# Patient Record
Sex: Female | Born: 1980 | Race: White | Hispanic: No | Marital: Married | Smoking: Never smoker
Health system: Southern US, Community
[De-identification: ages and names within clinical notes are randomized; demographics above are authoritative.]

## PROBLEM LIST (undated history)

## (undated) DIAGNOSIS — O99119 Other diseases of the blood and blood-forming organs and certain disorders involving the immune mechanism complicating pregnancy, unspecified trimester: Secondary | ICD-10-CM

## (undated) DIAGNOSIS — D696 Thrombocytopenia, unspecified: Secondary | ICD-10-CM

## (undated) DIAGNOSIS — R51 Headache: Secondary | ICD-10-CM

## (undated) DIAGNOSIS — O139 Gestational [pregnancy-induced] hypertension without significant proteinuria, unspecified trimester: Secondary | ICD-10-CM

## (undated) DIAGNOSIS — E039 Hypothyroidism, unspecified: Secondary | ICD-10-CM

## (undated) HISTORY — PX: MOUTH SURGERY: SHX715

---

## 2003-08-20 ENCOUNTER — Other Ambulatory Visit: Admission: RE | Admit: 2003-08-20 | Discharge: 2003-08-20 | Payer: Self-pay | Admitting: Obstetrics and Gynecology

## 2003-08-20 ENCOUNTER — Other Ambulatory Visit: Admission: RE | Admit: 2003-08-20 | Discharge: 2003-08-20 | Payer: Self-pay | Admitting: *Deleted

## 2004-09-03 ENCOUNTER — Other Ambulatory Visit: Admission: RE | Admit: 2004-09-03 | Discharge: 2004-09-03 | Payer: Self-pay | Admitting: Obstetrics and Gynecology

## 2004-11-15 ENCOUNTER — Ambulatory Visit (HOSPITAL_COMMUNITY): Admission: RE | Admit: 2004-11-15 | Discharge: 2004-11-15 | Payer: Self-pay | Admitting: *Deleted

## 2004-11-15 ENCOUNTER — Encounter (INDEPENDENT_AMBULATORY_CARE_PROVIDER_SITE_OTHER): Payer: Self-pay | Admitting: *Deleted

## 2005-09-06 ENCOUNTER — Other Ambulatory Visit: Admission: RE | Admit: 2005-09-06 | Discharge: 2005-09-06 | Payer: Self-pay | Admitting: Obstetrics & Gynecology

## 2006-09-12 ENCOUNTER — Other Ambulatory Visit: Admission: RE | Admit: 2006-09-12 | Discharge: 2006-09-12 | Payer: Self-pay | Admitting: Obstetrics & Gynecology

## 2007-10-18 ENCOUNTER — Other Ambulatory Visit: Admission: RE | Admit: 2007-10-18 | Discharge: 2007-10-18 | Payer: Self-pay | Admitting: Obstetrics and Gynecology

## 2008-07-21 ENCOUNTER — Inpatient Hospital Stay (HOSPITAL_COMMUNITY): Admission: AD | Admit: 2008-07-21 | Discharge: 2008-07-25 | Payer: Self-pay | Admitting: Obstetrics and Gynecology

## 2010-06-29 LAB — HEPATITIS B SURFACE ANTIGEN: Hepatitis B Surface Ag: NEGATIVE

## 2010-06-29 LAB — HIV ANTIBODY (ROUTINE TESTING W REFLEX): HIV: NONREACTIVE

## 2010-06-29 LAB — RPR: RPR: NONREACTIVE

## 2010-06-29 LAB — TYPE AND SCREEN: Antibody Screen: NEGATIVE

## 2010-07-18 NOTE — L&D Delivery Note (Signed)
This patient had quick progress in second stage II spontaneous vaginal delivery of a 9 lbs. 0 oz. female, Apgars 8 and 9, cord pH was sent and is pending. McRoberts maneuver with suprapubic pressure by the nursing assistant, moderate downward traction to effect delivery the anterior shoulder easily delivered the torso. Placenta spontaneous intact. EBL 300 cc. A superficial second-degree perineal laceration was repaired. Mother and baby doing well at that point

## 2010-11-01 LAB — COMPREHENSIVE METABOLIC PANEL
ALT: 14 U/L (ref 0–35)
ALT: 15 U/L (ref 0–35)
ALT: 16 U/L (ref 0–35)
AST: 20 U/L (ref 0–37)
AST: 21 U/L (ref 0–37)
AST: 26 U/L (ref 0–37)
AST: 28 U/L (ref 0–37)
Albumin: 2.8 g/dL — ABNORMAL LOW (ref 3.5–5.2)
Albumin: 2.9 g/dL — ABNORMAL LOW (ref 3.5–5.2)
Alkaline Phosphatase: 53 U/L (ref 39–117)
Alkaline Phosphatase: 61 U/L (ref 39–117)
Alkaline Phosphatase: 87 U/L (ref 39–117)
BUN: 6 mg/dL (ref 6–23)
BUN: 6 mg/dL (ref 6–23)
BUN: 7 mg/dL (ref 6–23)
CO2: 23 mEq/L (ref 19–32)
CO2: 24 mEq/L (ref 19–32)
CO2: 28 mEq/L (ref 19–32)
CO2: 28 mEq/L (ref 19–32)
Calcium: 8.4 mg/dL (ref 8.4–10.5)
Calcium: 8.5 mg/dL (ref 8.4–10.5)
Calcium: 8.5 mg/dL (ref 8.4–10.5)
Calcium: 8.9 mg/dL (ref 8.4–10.5)
Chloride: 102 mEq/L (ref 96–112)
Chloride: 104 mEq/L (ref 96–112)
Chloride: 104 mEq/L (ref 96–112)
Creatinine, Ser: 0.61 mg/dL (ref 0.4–1.2)
Creatinine, Ser: 0.66 mg/dL (ref 0.4–1.2)
Creatinine, Ser: 0.88 mg/dL (ref 0.4–1.2)
GFR calc Af Amer: >60 mL/min (ref >60)
GFR calc Af Amer: >60 mL/min (ref >60)
GFR calc Af Amer: >60 mL/min (ref >60)
GFR calc Af Amer: >60 mL/min (ref >60)
GFR calc non Af Amer: >60 mL/min (ref >60)
GFR calc non Af Amer: >60 mL/min (ref >60)
GFR calc non Af Amer: >60 mL/min (ref >60)
GFR calc non Af Amer: >60 mL/min (ref >60)
GFR calc non Af Amer: >60 mL/min (ref >60)
Glucose, Bld: 134 mg/dL — ABNORMAL HIGH (ref 70–99)
Glucose, Bld: 80 mg/dL (ref 70–99)
Glucose, Bld: 82 mg/dL (ref 70–99)
Glucose, Bld: 84 mg/dL (ref 70–99)
Potassium: 3.7 mEq/L (ref 3.5–5.1)
Potassium: 3.7 mEq/L (ref 3.5–5.1)
Potassium: 4.1 mEq/L (ref 3.5–5.1)
Potassium: 4.2 mEq/L (ref 3.5–5.1)
Sodium: 136 mEq/L (ref 135–145)
Sodium: 137 mEq/L (ref 135–145)
Sodium: 138 mEq/L (ref 135–145)
Total Bilirubin: 0.4 mg/dL (ref 0.3–1.2)
Total Bilirubin: 0.7 mg/dL (ref 0.3–1.2)
Total Bilirubin: 0.8 mg/dL (ref 0.3–1.2)
Total Protein: 5.5 g/dL — ABNORMAL LOW (ref 6.0–8.3)
Total Protein: 6.2 g/dL (ref 6.0–8.3)

## 2010-11-01 LAB — DIFFERENTIAL
Lymphocytes Relative: 19 % (ref 12–46)
Lymphs Abs: 2 10*3/uL (ref 0.7–4.0)
Neutrophils Relative %: 74 % (ref 43–77)

## 2010-11-01 LAB — URIC ACID
Uric Acid, Serum: 5.5 mg/dL (ref 2.4–7.0)
Uric Acid, Serum: 5.7 mg/dL (ref 2.4–7.0)

## 2010-11-01 LAB — CBC
HCT: 35 % — ABNORMAL LOW (ref 36.0–46.0)
Hemoglobin: 11 g/dL — ABNORMAL LOW (ref 12.0–15.0)
Hemoglobin: 11.9 g/dL — ABNORMAL LOW (ref 12.0–15.0)
Hemoglobin: 13.3 g/dL (ref 12.0–15.0)
Hemoglobin: 14.1 g/dL (ref 12.0–15.0)
MCHC: 33.6 g/dL (ref 30.0–36.0)
MCHC: 33.9 g/dL (ref 30.0–36.0)
MCHC: 33.9 g/dL (ref 30.0–36.0)
MCHC: 34 g/dL (ref 30.0–36.0)
MCV: 89.7 fL (ref 78.0–100.0)
MCV: 90.8 fL (ref 78.0–100.0)
MCV: 91 fL (ref 78.0–100.0)
Platelets: 116 10*3/uL — ABNORMAL LOW (ref 150–400)
Platelets: 142 10*3/uL — ABNORMAL LOW (ref 150–400)
Platelets: 153 10*3/uL (ref 150–400)
RBC: 3.32 MIL/uL — ABNORMAL LOW (ref 3.87–5.11)
RBC: 3.53 MIL/uL — ABNORMAL LOW (ref 3.87–5.11)
RBC: 4.4 MIL/uL (ref 3.87–5.11)
RDW: 13.7 % (ref 11.5–15.5)
RDW: 13.9 % (ref 11.5–15.5)
RDW: 14.1 % (ref 11.5–15.5)
WBC: 13.1 10*3/uL — ABNORMAL HIGH (ref 4.0–10.5)
WBC: 16 10*3/uL — ABNORMAL HIGH (ref 4.0–10.5)
WBC: 22.3 10*3/uL — ABNORMAL HIGH (ref 4.0–10.5)

## 2010-11-01 LAB — PLATELET COUNT: Platelets: 166 10*3/uL (ref 150–400)

## 2010-11-01 LAB — LACTATE DEHYDROGENASE
LDH: 165 U/L (ref 94–250)
LDH: 178 U/L (ref 94–250)

## 2010-11-30 NOTE — Discharge Summary (Signed)
Kara Reeves, Kara Reeves NO.:  [id number]   MEDICAL RECORD NO.:  [id number]          PATIENT TYPE:  INP   LOCATION:  9103                          FACILITY:  [facility]   PHYSICIAN:  Zelphia Cairo, MD    DATE OF BIRTH:  1981/04/08   DATE OF ADMISSION:  07/21/2008  DATE OF DISCHARGE:  07/25/2008                               DISCHARGE SUMMARY   ADMITTING DIAGNOSES:  1. Intrauterine pregnancy at 40+ weeks' estimated gestational age.  2. Two-stage induction of labor.   DISCHARGE DIAGNOSES:  1. Status post spontaneous vaginal delivery.  2. Viable female infant.  3. Pregnancy-induced hypertension, resolving.   REASON FOR ADMISSION:  Please see written H and P.   HOSPITAL COURSE:  The patient is a 30 year old primigravida who was  admitted to [facility] for two-stage induction of labor  secondary to postdates.  On admission, vital signs were stable, temp  98.9, blood pressure 120/78.  Lungs were clear to auscultation.  Fetal  heart tones were reactive at 142.  Cervix was closed with vertex  presentation with ultrasound confirming vertex presentation.  The  patient was now admitted for a two-stage induction of labor.  On the  following morning, fetal heart tones continued to be reassuring.  Contractions were approximately every 2-4 minutes.  Artificial rupture  of membranes was performed which was clear fluid.  Blood pressures were  now 130s-140s over 80.  Cervix was 3 cm dilated, 50% effaced, vertex at  a -2 station.  The patient was started on Pitocin to augment her labor.  Later that morning, fetal heart tones continued to be reassuring in the  130s-140s vertex presentation.  Cervix was now 4 cm, 70% effaced, vertex  at a -1 station.  Intrauterine pressure catheter was placed without  difficulty.  Blood pressures were running 140s over 80s-90s.  CBC had  revealed platelets down to 116.  Liver function tests were within normal  limits.  The patient was  placed on magnesium sulfate prophylaxis.  CBC  and CMET were scheduled to be repeated in approximately 4 hours.  Epidural was placed for the patient's comfort.  She did achieve full  dilatation of the vertex at a 0 station.  The patient continued on  magnesium sulfate, and after pushing for approximately 1 hour the  patient did deliver a viable female infant with Apgars of 9 at 1 minute  and 9 at 5 minutes.  She did sustain a second-degree and partial third-  degree laceration which was apparently with 3-0 Vicryl.  Fundus was  firm.  Estimated blood loss was 400 mL.  On postpartum day 1, the  patient was feeling well.  Blood pressures were 120s-140s over 67-85.  She was experiencing good diuresis.  Fundus was continued to be firm.  Lochia moderate.  Lab work revealed hemoglobin of 11.9, platelet count  153,000, WBC of 22.3, AST was 31, ALT was 13.  Magnesium sulfate was  discontinued, and the patient was later transferred to the floor.  On  postpartum day 2, the patient denied headache or blurred vision or right  upper quadrant pain.  She did complain of some perineal discomfort.  Vital signs were stable.  Blood pressure 126/85.  Deep tendon reflexes  2+, no clonus, 1+ edema to the knees was observed.  Fundus was firm,  nontender.  Labia was edematous, ecchymosis noted bilaterally.  Laboratory findings showed hemoglobin stable at 11.0, platelet count  however was noted to be somewhat lower at 138, WBC count was 16.0.  Decision was made to continue to observe the patient an additional day  and followup PIH labs.  Ice pack and heat was applied to perineum.  On  postpartum day 3, the patient continued to deny headache, blurred  vision, or right upper quadrant pain.  Perineum was improving.  Vital  signs remained stable.  Blood pressure was 127/81 to 120/82.  Fundus  firm, nontender.  Labia edema resolving, ecchymosis continued, but  however was soft.  Laboratory findings showed hemoglobin of  10.2,  platelet count was up to 144,000, AST was 28, ALT was 21.  Discharge  instructions were reviewed, and the patient was later discharged home.   CONDITION ON DISCHARGE:  Stable.   DIET:  Regular as tolerated.   ACTIVITY:  Up as tolerated.  She was instructed to have no vaginal  entry.  She was continued with sitz baths in 15-20 minutes 2 times per  day.  The patient was also instructed call for headache, blurred vision,  or right upper quadrant pain.   DISCHARGE MEDICATIONS:  1. Darvocet-N 100 #30 one p.o. every 4-6 hours p.r.n.  2. Motrin 600 mg every 6 hours.   Otherwise, the patient was instructed to return to the office in  approximately 1-2 weeks for a blood pressure check.      Julio Sicks, N.P.      Zelphia Cairo, MD  Electronically Signed    CC/MEDQ  D:  07/25/2008  T:  07/25/2008  Job:  [id number]

## 2010-12-22 ENCOUNTER — Ambulatory Visit (HOSPITAL_COMMUNITY)
Admission: RE | Admit: 2010-12-22 | Discharge: 2010-12-22 | Disposition: A | Discharge status: Home / Self Care | Payer: BC Managed Care – PPO | Source: Ambulatory Visit | Attending: Obstetrics and Gynecology | Admitting: Obstetrics and Gynecology

## 2010-12-22 DIAGNOSIS — IMO0002 Reserved for concepts with insufficient information to code with codable children: Secondary | ICD-10-CM | POA: Insufficient documentation

## 2010-12-22 DIAGNOSIS — R609 Edema, unspecified: Secondary | ICD-10-CM

## 2011-01-31 ENCOUNTER — Other Ambulatory Visit: Payer: Self-pay | Admitting: Obstetrics and Gynecology

## 2011-01-31 ENCOUNTER — Inpatient Hospital Stay (HOSPITAL_COMMUNITY)
Admission: AD | Admit: 2011-01-31 | Discharge: 2011-02-03 | DRG: 372 | Disposition: A | Discharge status: Home / Self Care | Payer: BC Managed Care – PPO | Source: Ambulatory Visit | Attending: Obstetrics and Gynecology | Admitting: Obstetrics and Gynecology

## 2011-01-31 ENCOUNTER — Encounter (HOSPITAL_COMMUNITY): Payer: Self-pay | Admitting: *Deleted

## 2011-01-31 DIAGNOSIS — O9912 Other diseases of the blood and blood-forming organs and certain disorders involving the immune mechanism complicating childbirth: Secondary | ICD-10-CM | POA: Diagnosis present

## 2011-01-31 DIAGNOSIS — D696 Thrombocytopenia, unspecified: Secondary | ICD-10-CM | POA: Insufficient documentation

## 2011-01-31 DIAGNOSIS — O139 Gestational [pregnancy-induced] hypertension without significant proteinuria, unspecified trimester: Secondary | ICD-10-CM | POA: Diagnosis present

## 2011-01-31 DIAGNOSIS — Z22330 Carrier of Group B streptococcus: Secondary | ICD-10-CM

## 2011-01-31 DIAGNOSIS — O99892 Other specified diseases and conditions complicating childbirth: Secondary | ICD-10-CM | POA: Diagnosis present

## 2011-01-31 DIAGNOSIS — D689 Coagulation defect, unspecified: Secondary | ICD-10-CM | POA: Diagnosis present

## 2011-01-31 DIAGNOSIS — E039 Hypothyroidism, unspecified: Secondary | ICD-10-CM | POA: Diagnosis present

## 2011-01-31 DIAGNOSIS — O99284 Endocrine, nutritional and metabolic diseases complicating childbirth: Secondary | ICD-10-CM | POA: Diagnosis present

## 2011-01-31 DIAGNOSIS — E079 Disorder of thyroid, unspecified: Secondary | ICD-10-CM | POA: Diagnosis present

## 2011-01-31 HISTORY — DX: Hypothyroidism, unspecified: E03.9

## 2011-01-31 HISTORY — DX: Other diseases of the blood and blood-forming organs and certain disorders involving the immune mechanism complicating pregnancy, unspecified trimester: O99.119

## 2011-01-31 HISTORY — DX: Headache: R51

## 2011-01-31 HISTORY — DX: Gestational (pregnancy-induced) hypertension without significant proteinuria, unspecified trimester: O13.9

## 2011-01-31 HISTORY — DX: Thrombocytopenia, unspecified: D69.6

## 2011-01-31 LAB — CBC
HCT: 39.2 % (ref 36.0–46.0)
Hemoglobin: 13.5 g/dL (ref 12.0–15.0)
MCHC: 34.4 g/dL (ref 30.0–36.0)
RBC: 4.48 MIL/uL (ref 3.87–5.11)

## 2011-01-31 LAB — COMPREHENSIVE METABOLIC PANEL
ALT: 11 U/L (ref 0–35)
Alkaline Phosphatase: 91 U/L (ref 39–117)
BUN: 9 mg/dL (ref 6–23)
CO2: 21 mEq/L (ref 19–32)
GFR calc Af Amer: >60 mL/min (ref >60)
GFR calc non Af Amer: >60 mL/min (ref >60)
Glucose, Bld: 87 mg/dL (ref 70–99)
Potassium: 3.8 mEq/L (ref 3.5–5.1)
Sodium: 135 mEq/L (ref 135–145)
Total Bilirubin: 0.4 mg/dL (ref 0.3–1.2)
Total Protein: 6.8 g/dL (ref 6.0–8.3)

## 2011-01-31 LAB — LACTATE DEHYDROGENASE: LDH: 281 U/L — ABNORMAL HIGH (ref 94–250)

## 2011-01-31 MED ORDER — LACTATED RINGERS IV SOLN
INTRAVENOUS | Status: DC
  Administered 2011-01-31: 22:00:00 via INTRAVENOUS

## 2011-01-31 MED ORDER — ZOLPIDEM TARTRATE 10 MG PO TABS
10.0000 mg | ORAL_TABLET | Freq: Every evening | ORAL | Status: DC | PRN
  Administered 2011-01-31: 10 mg via ORAL
  Filled 2011-01-31: qty 1

## 2011-01-31 MED ORDER — ONDANSETRON HCL 4 MG/2ML IJ SOLN: 4.0000 mg | Freq: Four times a day (QID) | INTRAMUSCULAR | Status: DC | PRN

## 2011-01-31 MED ORDER — MISOPROSTOL 25 MCG QUARTER TABLET
25.0000 ug | ORAL_TABLET | ORAL | Status: DC
  Filled 2011-01-31: qty 0.25

## 2011-01-31 MED ORDER — CITRIC ACID-SODIUM CITRATE 334-500 MG/5ML PO SOLN: 30.0000 mL | ORAL | Status: DC | PRN

## 2011-01-31 MED ORDER — LEVOTHYROXINE SODIUM 100 MCG PO TABS
100.0000 ug | ORAL_TABLET | Freq: Every day | ORAL | Status: DC
  Administered 2011-02-01: 100 ug via ORAL
  Filled 2011-01-31 (×3): qty 1

## 2011-01-31 MED ORDER — LACTATED RINGERS IV SOLN: 500.0000 mL | INTRAVENOUS | Status: DC | PRN

## 2011-01-31 MED ORDER — MISOPROSTOL 25 MCG QUARTER TABLET
25.0000 ug | ORAL_TABLET | ORAL | Status: AC
  Administered 2011-02-01: 25 ug via VAGINAL
  Filled 2011-01-31: qty 0.25

## 2011-01-31 MED ORDER — IBUPROFEN 600 MG PO TABS
600.0000 mg | ORAL_TABLET | Freq: Four times a day (QID) | ORAL | Status: DC | PRN
  Administered 2011-02-01: 600 mg via ORAL
  Filled 2011-01-31: qty 1

## 2011-01-31 MED ORDER — FLEET ENEMA 7-19 GM/118ML RE ENEM: 1.0000 | ENEMA | RECTAL | Status: DC | PRN

## 2011-01-31 MED ORDER — MISOPROSTOL 25 MCG QUARTER TABLET
25.0000 ug | ORAL_TABLET | Freq: Once | ORAL | Status: AC
  Administered 2011-01-31: 25 ug via VAGINAL

## 2011-01-31 MED ORDER — ACETAMINOPHEN 325 MG PO TABS: 650.0000 mg | ORAL_TABLET | ORAL | Status: DC | PRN

## 2011-01-31 MED ORDER — LIDOCAINE HCL (PF) 1 % IJ SOLN
30.0000 mL | INTRAMUSCULAR | Status: DC | PRN
  Filled 2011-01-31: qty 30

## 2011-01-31 MED ORDER — NALBUPHINE SYRINGE 5 MG/0.5 ML
5.0000 mg | INJECTION | INTRAMUSCULAR | Status: DC | PRN
  Filled 2011-01-31: qty 0.5

## 2011-01-31 MED ORDER — OXYTOCIN 20 UNITS IN LACTATED RINGERS INFUSION - SIMPLE
125.0000 mL/h | Freq: Once | INTRAVENOUS | Status: DC
  Filled 2011-01-31: qty 1000

## 2011-01-31 MED ORDER — OXYCODONE-ACETAMINOPHEN 5-325 MG PO TABS: 2.0000 | ORAL_TABLET | ORAL | Status: DC | PRN

## 2011-01-31 MED ORDER — CLINDAMYCIN PHOSPHATE 900 MG/50ML IV SOLN
900.0000 mg | Freq: Three times a day (TID) | INTRAVENOUS | Status: DC
  Administered 2011-02-01 (×2): 900 mg via INTRAVENOUS
  Filled 2011-01-31 (×3): qty 50

## 2011-02-01 ENCOUNTER — Inpatient Hospital Stay (HOSPITAL_COMMUNITY): Payer: BC Managed Care – PPO | Admitting: Anesthesiology

## 2011-02-01 ENCOUNTER — Encounter (HOSPITAL_COMMUNITY): Payer: Self-pay | Admitting: *Deleted

## 2011-02-01 ENCOUNTER — Encounter (HOSPITAL_COMMUNITY): Payer: Self-pay | Admitting: Anesthesiology

## 2011-02-01 LAB — CBC
HCT: 38.1 % (ref 36.0–46.0)
HCT: 40.9 % (ref 36.0–46.0)
Hemoglobin: 14 g/dL (ref 12.0–15.0)
MCH: 30.2 pg (ref 26.0–34.0)
MCH: 30.1 pg (ref 26.0–34.0)
MCHC: 34.2 g/dL (ref 30.0–36.0)
MCV: 87.8 fL (ref 78.0–100.0)
RBC: 4.34 MIL/uL (ref 3.87–5.11)
RDW: 14.3 % (ref 11.5–15.5)
RDW: 14.3 % (ref 11.5–15.5)
WBC: 12 10*3/uL — ABNORMAL HIGH (ref 4.0–10.5)

## 2011-02-01 MED ORDER — EPHEDRINE 5 MG/ML INJ
10.0000 mg | INTRAVENOUS | Status: DC | PRN
  Filled 2011-02-01: qty 4

## 2011-02-01 MED ORDER — PRENATAL PLUS 27-1 MG PO TABS
1.0000 | ORAL_TABLET | Freq: Every day | ORAL | Status: DC
  Filled 2011-02-01 (×3): qty 1

## 2011-02-01 MED ORDER — DIPHENHYDRAMINE HCL 25 MG PO CAPS: 25.0000 mg | ORAL_CAPSULE | Freq: Four times a day (QID) | ORAL | Status: DC | PRN

## 2011-02-01 MED ORDER — ONDANSETRON HCL 4 MG PO TABS: 4.0000 mg | ORAL_TABLET | ORAL | Status: DC | PRN

## 2011-02-01 MED ORDER — TETANUS-DIPHTH-ACELL PERTUSSIS 5-2.5-18.5 LF-MCG/0.5 IM SUSP
0.5000 mL | Freq: Once | INTRAMUSCULAR | Status: DC
  Filled 2011-02-01: qty 0.5

## 2011-02-01 MED ORDER — PRENATAL PLUS 27-1 MG PO TABS
1.0000 | ORAL_TABLET | Freq: Every day | ORAL | Status: DC
  Administered 2011-02-01 – 2011-02-02 (×3): 1 via ORAL

## 2011-02-01 MED ORDER — SODIUM CHLORIDE 0.9 % IJ SOLN: 3.0000 mL | INTRAMUSCULAR | Status: DC | PRN

## 2011-02-01 MED ORDER — DIPHENHYDRAMINE HCL 50 MG/ML IJ SOLN: 12.5000 mg | INTRAMUSCULAR | Status: DC | PRN

## 2011-02-01 MED ORDER — FENTANYL 2.5 MCG/ML BUPIVACAINE 1/10 % EPIDURAL INFUSION (WH - ANES)
14.0000 mL/h | INTRAMUSCULAR | Status: DC
  Administered 2011-02-01 (×2): 14 mL/h via EPIDURAL
  Filled 2011-02-01 (×3): qty 60

## 2011-02-01 MED ORDER — BENZOCAINE-MENTHOL 20-0.5 % EX AERO
INHALATION_SPRAY | CUTANEOUS | Status: AC
  Administered 2011-02-01: 1 via TOPICAL
  Filled 2011-02-01: qty 56

## 2011-02-01 MED ORDER — OXYTOCIN 20 UNITS IN LACTATED RINGERS INFUSION - SIMPLE: 125.0000 mL/h | INTRAVENOUS | Status: DC | PRN

## 2011-02-01 MED ORDER — SODIUM CHLORIDE 0.9 % IJ SOLN: 3.0000 mL | Freq: Two times a day (BID) | INTRAMUSCULAR | Status: DC

## 2011-02-01 MED ORDER — BENZOCAINE-MENTHOL 20-0.5 % EX AERO
1.0000 "application " | INHALATION_SPRAY | CUTANEOUS | Status: DC | PRN
  Administered 2011-02-01: 1 via TOPICAL

## 2011-02-01 MED ORDER — NATALCARE PIC 60-1 MG PO TABS: 1.0000 | ORAL_TABLET | ORAL | Status: DC

## 2011-02-01 MED ORDER — SODIUM CHLORIDE 0.9 % IV SOLN: 250.0000 mL | INTRAVENOUS | Status: DC

## 2011-02-01 MED ORDER — SENNOSIDES-DOCUSATE SODIUM 8.6-50 MG PO TABS
1.0000 | ORAL_TABLET | Freq: Every day | ORAL | Status: DC
  Administered 2011-02-01: 1 via ORAL
  Administered 2011-02-02: 2 via ORAL

## 2011-02-01 MED ORDER — OXYTOCIN 20 UNITS IN LACTATED RINGERS INFUSION - SIMPLE
1.0000 m[IU]/min | INTRAVENOUS | Status: DC
  Administered 2011-02-01: 1 m[IU]/min via INTRAVENOUS

## 2011-02-01 MED ORDER — WITCH HAZEL-GLYCERIN EX PADS: MEDICATED_PAD | CUTANEOUS | Status: DC | PRN

## 2011-02-01 MED ORDER — EPHEDRINE 5 MG/ML INJ
10.0000 mg | INTRAVENOUS | Status: DC | PRN
  Filled 2011-02-01 (×2): qty 4

## 2011-02-01 MED ORDER — SIMETHICONE 80 MG PO CHEW: 80.0000 mg | CHEWABLE_TABLET | ORAL | Status: DC | PRN

## 2011-02-01 MED ORDER — PHENYLEPHRINE 40 MCG/ML (10ML) SYRINGE FOR IV PUSH (FOR BLOOD PRESSURE SUPPORT)
80.0000 ug | PREFILLED_SYRINGE | INTRAVENOUS | Status: DC | PRN
  Filled 2011-02-01: qty 5

## 2011-02-01 MED ORDER — TERBUTALINE SULFATE 1 MG/ML IJ SOLN: 0.2500 mg | Freq: Once | INTRAMUSCULAR | Status: DC | PRN

## 2011-02-01 MED ORDER — PHENYLEPHRINE 40 MCG/ML (10ML) SYRINGE FOR IV PUSH (FOR BLOOD PRESSURE SUPPORT)
80.0000 ug | PREFILLED_SYRINGE | INTRAVENOUS | Status: DC | PRN
  Filled 2011-02-01 (×2): qty 5

## 2011-02-01 MED ORDER — MEASLES, MUMPS & RUBELLA VAC ~~LOC~~ INJ
0.5000 mL | INJECTION | Freq: Once | SUBCUTANEOUS | Status: DC
  Filled 2011-02-01: qty 0.5

## 2011-02-01 MED ORDER — BISACODYL 10 MG RE SUPP: 10.0000 mg | Freq: Every day | RECTAL | Status: DC | PRN

## 2011-02-01 MED ORDER — LEVOTHYROXINE SODIUM 50 MCG PO TABS
50.0000 ug | ORAL_TABLET | Freq: Every day | ORAL | Status: DC
  Administered 2011-02-02: 50 ug via ORAL
  Filled 2011-02-01 (×3): qty 1

## 2011-02-01 NOTE — Anesthesia Preprocedure Evaluation (Addendum)
Anesthesia Evaluation  Name, MR# and DOB Patient awake  General Assessment Comment  Reviewed: Allergy & Precautions, H&P , Patient's Chart, lab work & pertinent test results and reviewed documented beta blocker date and time   Airway Mallampati: I TM Distance: >3 FB Neck ROM: full    Dental  (+) Teeth Intact   Pulmonaryneg pulmonary ROS    clear to auscultation  pulmonary exam normal   Cardiovascular regular Normal   Neuro/PsychNegative Neurological ROS Negative Psych ROS  GI/Hepatic/Renal negative GI ROS, negative Liver ROS, and negative Renal ROS (+)       Endo/Other  Negative Endocrine ROS (+)   Abdominal   Musculoskeletal  Hematology negative hematology ROS (+)   Peds  Reproductive/Obstetrics (+) Pregnancy   Anesthesia Other Findings             Anesthesia Physical Anesthesia Plan  ASA: II  Anesthesia Plan: Epidural   Post-op Pain Management:    Induction:   Airway Management Planned:   Additional Equipment:   Intra-op Plan:   Post-operative Plan:   Informed Consent: I have reviewed the patients History and Physical, chart, labs and discussed the procedure including the risks, benefits and alternatives for the proposed anesthesia with the patient or authorized representative who has indicated his/her understanding and acceptance.   History available from chart only  Plan Discussed with: Anesthesiologist  Anesthesia Plan Comments:        Anesthesia Quick Evaluation

## 2011-02-01 NOTE — H&P (Signed)
Kara Reeves is a 30 y.o. female presenting for IOL. History  29yo G2P1 @ 39+2 weeks presents for IOL because of elevated BP and low platelets.  Pt c/o HA and has h/o pre-eclampsia w/ prior pregnancy.  Denies ctx, vb, and lof.  + FM  OB History    Grav Para Term Preterm Abortions TAB SAB Ect Mult Living   2 1 1  0 0 0 0 0 0 1     Past Medical History  Diagnosis Date  . Hypothyroidism   . Pregnancy induced hypertension   . Thrombocytopenia complicating pregnancy with both pregnancies  . Headache    Past Surgical History  Procedure Date  . Mouth surgery wisdom teeth extracted and anchors placed,    Family History: family history is negative for Anesthesia problems, and Hypotension, and Malignant hyperthermia, and Pseudochol deficiency, . Social History:  reports that she has never smoked. She has never used smokeless tobacco. She reports that she does not drink alcohol or use illicit drugs.  Review of Systems  Constitutional: Negative.   Eyes: Negative.   Respiratory: Negative.   Cardiovascular: Negative.   Gastrointestinal: Negative.   Genitourinary: Negative.   Skin: Negative.   Neurological: Positive for headaches.  Endo/Heme/Allergies: Negative.   Psychiatric/Behavioral: Negative.     Dilation: 1.5 Effacement (%): Thick Station: Ballotable Exam by:: Cammy Copa, RN Blood pressure 129/85, pulse 97, temperature 98.7 F (37.1 C), temperature source Oral, resp. rate 18, height 5' 5.5" (1.664 m), weight 101.606 kg (224 lb). Maternal Exam:  Abdomen: Patient reports no abdominal tenderness.   Physical Exam  Constitutional: She is oriented to person, place, and time. She appears well-developed.  HENT:  Head: Normocephalic.  Neck: Normal range of motion. Neck supple.  Respiratory: Effort normal.  Neurological: She is alert and oriented to person, place, and time.    Prenatal labs: ABO, Rh:   Antibody: Negative (12/13 0000) Rubella:   RPR: NON REACTIVE  (07/16 2150)  HBsAg: Negative (12/13 0000)  HIV: Non-reactive (12/13 0000)  GBS: Positive (07/16 0000)   Assessment/Plan: Admit Cytotec induction Clindamycin for GBS prophylaxis Mag if HA persists or BP to severe pre-e range   Sha Amer 02/01/2011, 7:17 AM

## 2011-02-01 NOTE — Progress Notes (Signed)
AROM, now 2/50/post/vtx..Oncology PCN for + GBS, will start pitocin.

## 2011-02-02 LAB — CBC
HCT: 36.8 % (ref 36.0–46.0)
Hemoglobin: 12.6 g/dL (ref 12.0–15.0)
MCH: 30.1 pg (ref 26.0–34.0)
MCHC: 34.2 g/dL (ref 30.0–36.0)
RDW: 14.6 % (ref 11.5–15.5)

## 2011-02-02 MED ORDER — OXYCODONE-ACETAMINOPHEN 5-325 MG PO TABS: 1.0000 | ORAL_TABLET | Freq: Four times a day (QID) | ORAL | Status: DC | PRN

## 2011-02-02 MED ORDER — IBUPROFEN 800 MG PO TABS
800.0000 mg | ORAL_TABLET | Freq: Three times a day (TID) | ORAL | Status: DC | PRN
  Administered 2011-02-02 (×3): 800 mg via ORAL
  Filled 2011-02-02 (×3): qty 1

## 2011-02-02 MED ORDER — FENTANYL 2.5 MCG/ML BUPIVACAINE 1/10 % EPIDURAL INFUSION (WH - ANES)
INTRAMUSCULAR | Status: DC | PRN
  Administered 2011-02-01: 14 mL/h via EPIDURAL

## 2011-02-02 NOTE — Progress Notes (Signed)
Post Partum Day 1 Subjective: no complaints, Denies Ha, blurred vision C/o labial edema Objective: Blood pressure 119/78, pulse 80, temperature 97.6 F (36.4 C), temperature source Oral, resp. rate 18, height 5' 5.5" (1.664 m), weight 101.606 kg (224 lb), SpO2 97.00%, unknown if currently breastfeeding.  Physical Exam:  General: alert, cooperative and no distress Lochia: appropriate Uterine Fundus: firm, U/Even Labial edema noted, perineum intact DVT Evaluation: No evidence of DVT seen on physical exam.   Basename 02/02/11 0530 02/01/11 1729  HGB 12.6 14.0  HCT 36.8 40.9    Assessment/Plan: Plan for discharge tomorrow Continue ice pack to perineum  LOS: 2 days   CURTIS,CAROL G 02/02/2011, 8:24 AM

## 2011-02-02 NOTE — Anesthesia Postprocedure Evaluation (Signed)
Vital signs stable Patient alert Pain and nausea are controlled No apparent anesthetic complications No follow up care needed Pt may be d/c to floor when NM function returns 

## 2011-02-02 NOTE — Progress Notes (Signed)
Assisted mom with getting the baby on with deeper latch. Baby wants to slide down to tip of nipple. Encouraged use of pillows and plenty of support .

## 2011-02-02 NOTE — Progress Notes (Signed)
UR chart review completed.  

## 2011-02-02 NOTE — Addendum Note (Signed)
Addendum  created 02/02/11 1610 by Sandrea Hughs.   Modules edited:Anesthesia Flowsheet

## 2011-02-02 NOTE — Anesthesia Postprocedure Evaluation (Signed)
Vital signs stable Patient alert Pain and nausea are controlled No apparent anesthetic complications No follow up care needed Pt may be d/c when NM fxn  returns

## 2011-02-02 NOTE — Anesthesia Procedure Notes (Signed)
Epidural Patient location during procedure: OB Start time: 02/01/2011 10:48 AM End time: 02/01/2011 10:59 AM Reason for block: procedure for pain  Staffing Anesthesiologist: Sandrea Hughs Performed by: anesthesiologist   Preanesthetic Checklist Completed: patient identified, site marked, surgical consent, pre-op evaluation, timeout performed, IV checked, risks and benefits discussed and monitors and equipment checked  Epidural Patient position: sitting Prep: DuraPrep Patient monitoring: continuous pulse ox and blood pressure Approach: midline Injection technique: LOR air  Needle:  Needle type: Tuohy  Needle gauge: 17 G Needle length: 9 cm Needle insertion depth: 5 cm Catheter type: closed end flexible Catheter size: 19 Gauge Catheter at skin depth: 10 cm Test dose: negative and 1.5% lidocaine  Assessment Sensory level: T8 Events: blood not aspirated, injection not painful, no injection resistance, negative IV test and no paresthesia  Additional Notes Pt comfortable. See nursing notes for VS and FHR

## 2011-02-03 MED ORDER — LEVOTHYROXINE SODIUM 50 MCG PO TABS: 50.0000 ug | ORAL_TABLET | Freq: Every day | ORAL | Status: AC

## 2011-02-03 NOTE — Progress Notes (Signed)
Post Partum Day 2 Subjective: no complaints and up ad lib  Objective: Blood pressure 124/85, pulse 74, temperature 98 F (36.7 C), temperature source Oral, resp. rate 18, height 5' 5.5" (1.664 m), weight 101.606 kg (224 lb), SpO2 98.00%, unknown if currently breastfeeding.  Physical Exam:  General: alert, cooperative and no distress Lochia: appropriate Uterine Fundus: firm Perineum intact, labial edema resolving DVT Evaluation: No evidence of DVT seen on physical exam.   Basename 02/02/11 0530 02/01/11 1729  HGB 12.6 14.0  HCT 36.8 40.9    Assessment/Plan: Discharge home RTO 4-6 weeks   LOS: 3 days   CURTIS,CAROL G 02/03/2011, 7:48 AM

## 2011-02-03 NOTE — Discharge Summary (Signed)
Obstetric Discharge Summary Reason for Admission: onset of labor Prenatal Procedures: none Intrapartum Procedures: spontaneous vaginal delivery Postpartum Procedures: none Complications-Operative and Postpartum: none  Hemoglobin  Date Value Range Status  02/02/2011 12.6  12.0-15.0 (g/dL) Final     HCT  Date Value Range Status  02/02/2011 36.8  36.0-46.0 (%) Final    Discharge Diagnoses: Term Pregnancy-delivered  Discharge Information: Date: 02/03/2011 Activity: pelvic rest Diet: routine Medications: PNV and Ibuprophen Condition: stable Instructions: refer to practice specific booklet Discharge to: home   Newborn Data: Live born  Information for the patient's newborn:  Shawnae, Leiva Girl Allyana [[id number]]  female   Home with mother.  CURTIS,CAROL G 02/03/2011, 7:50 AM

## 2011-02-03 NOTE — Progress Notes (Signed)
Mother states breastfeeding going well , denies soreness. States she has hand pump at home. Didn't see latch, infant just fed for 10 min.

## 2011-02-03 NOTE — Progress Notes (Addendum)
A user error has taken place: charting done on wrong patient and has been corrected.

## 2011-03-01 ENCOUNTER — Other Ambulatory Visit: Payer: Self-pay | Admitting: Gastroenterology

## 2011-03-01 DIAGNOSIS — R110 Nausea: Secondary | ICD-10-CM

## 2011-03-04 ENCOUNTER — Ambulatory Visit
Admission: RE | Admit: 2011-03-04 | Discharge: 2011-03-04 | Disposition: A | Discharge status: Home / Self Care | Payer: BC Managed Care – PPO | Source: Ambulatory Visit | Attending: Gastroenterology | Admitting: Gastroenterology

## 2011-03-04 DIAGNOSIS — R110 Nausea: Secondary | ICD-10-CM

## 2012-01-13 IMAGING — US US ABDOMEN COMPLETE
1 series · 14 of 25 positions shown · non-contrast
Comparison: None.

CLINICAL DATA: Abdominal pain, nausea

COMPLETE ABDOMINAL ULTRASOUND

[Series 1: us abdomen complete · 0.20mm/px · 14 of 71 slices shown]
[im 1/71]
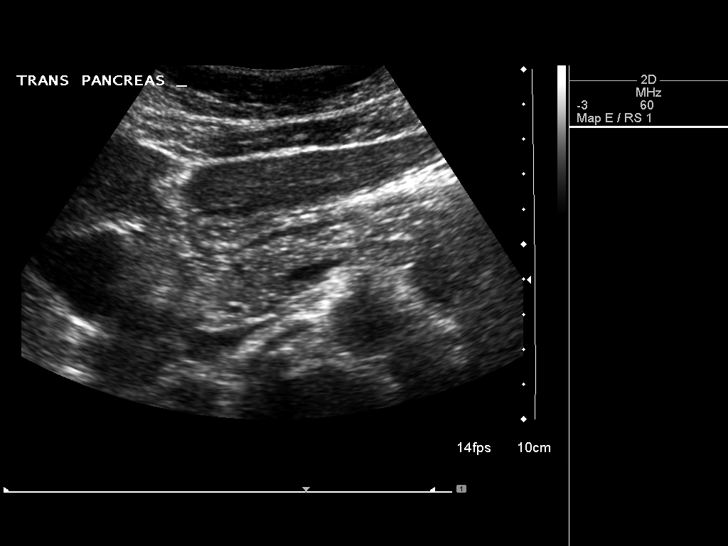
[im 6/71]
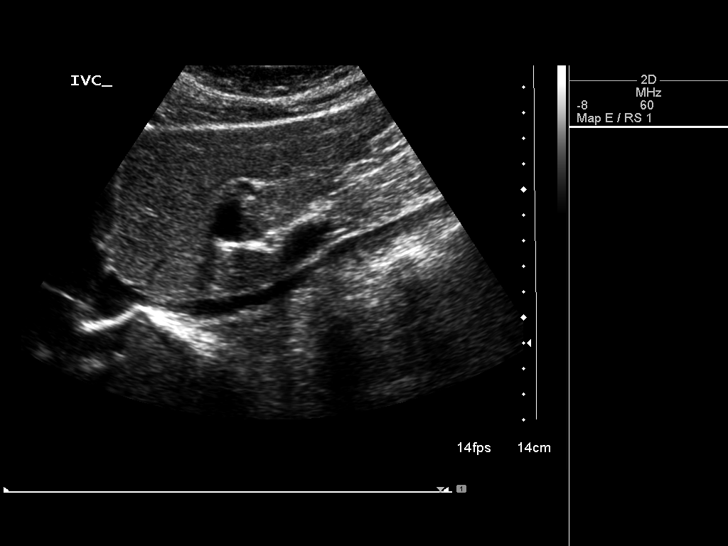
[im 12/71]
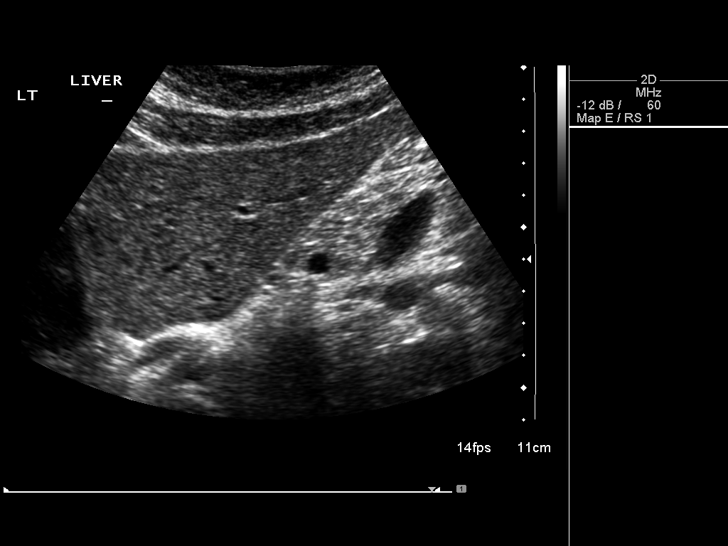
[im 18/71]
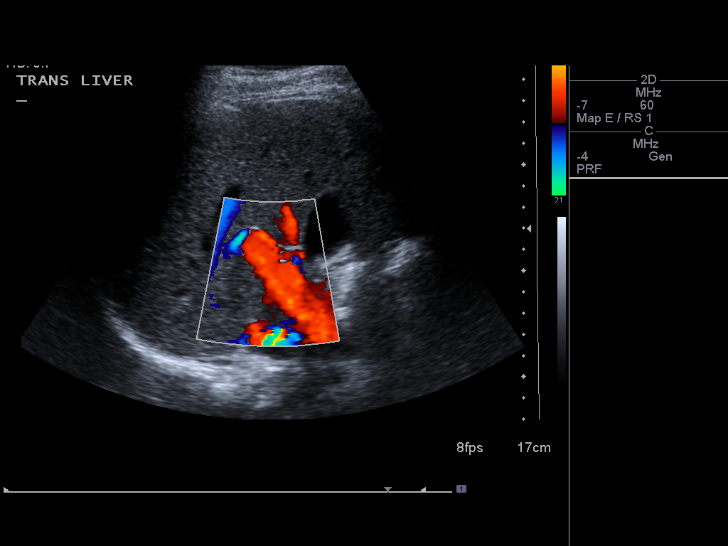
[im 24/71]
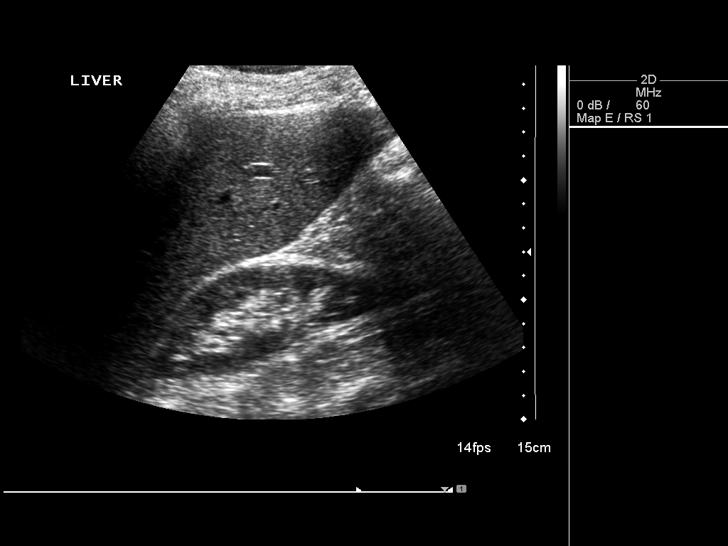
[im 27/71]
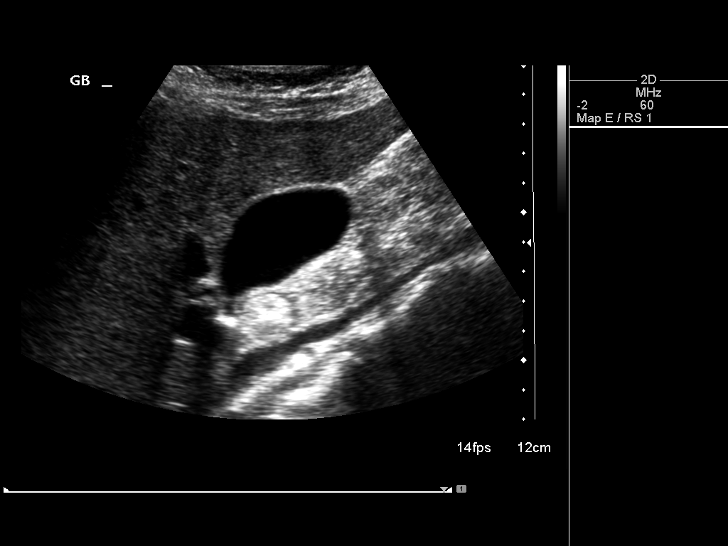
[im 33/71]
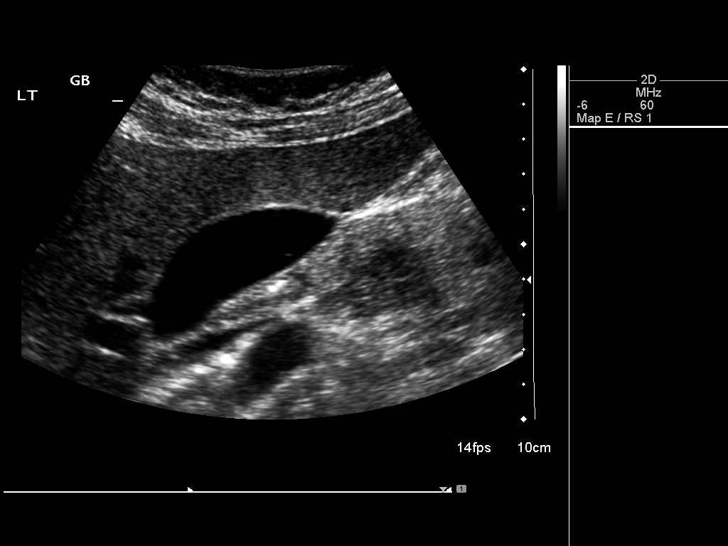
[im 38/71]
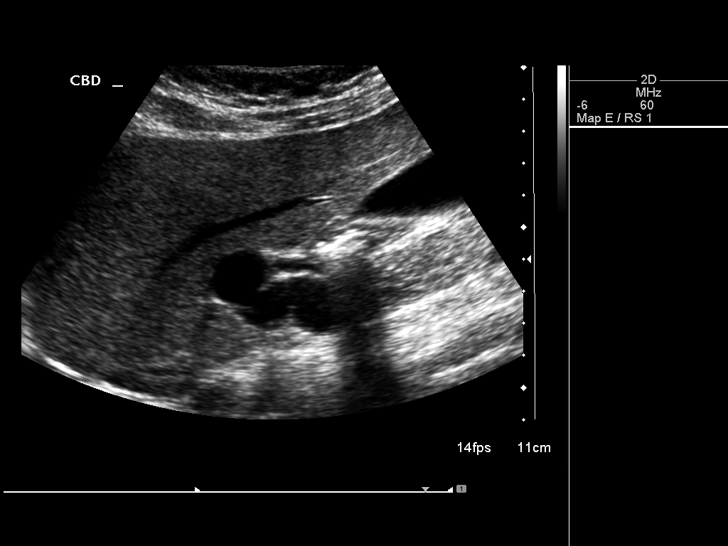
[im 44/71]
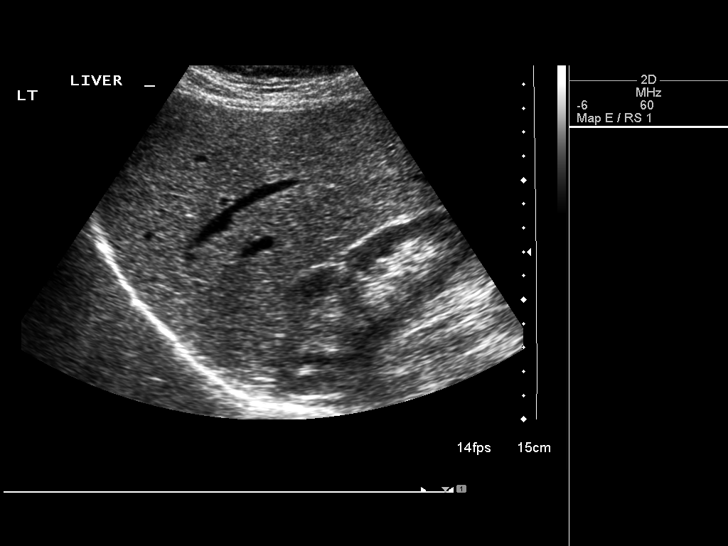
[im 47/71]
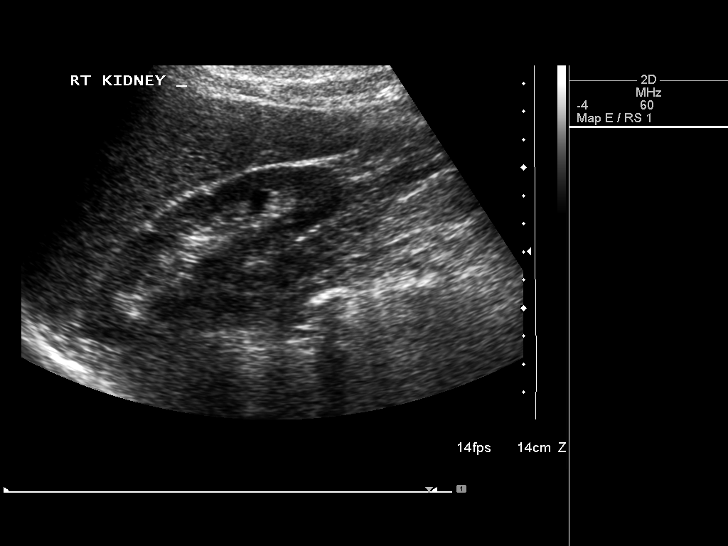
[im 53/71]
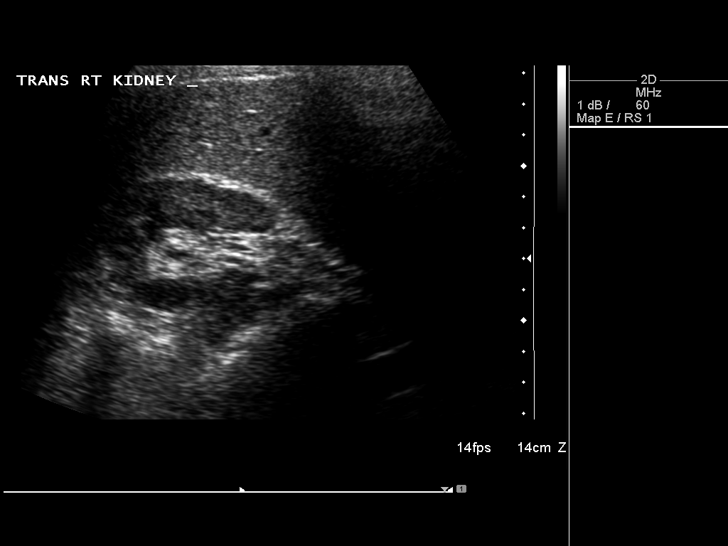
[im 59/71]
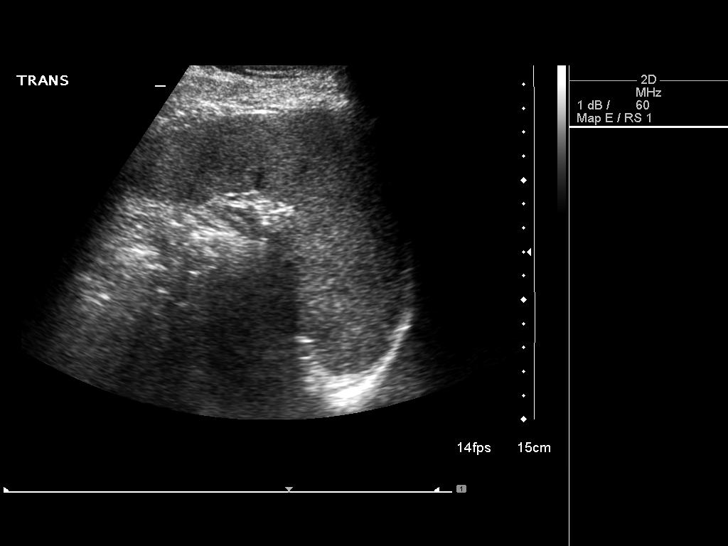
[im 65/71]
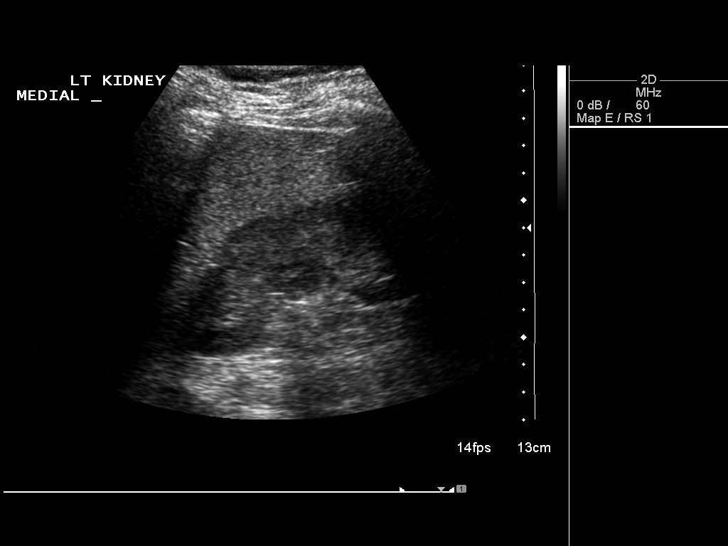
[im 71/71]
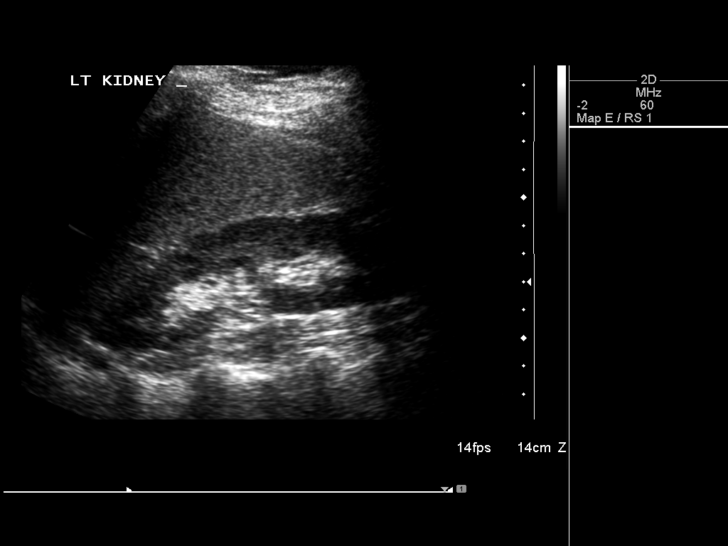

[14 of 25 positions shown; findings below may reference images not displayed]

FINDINGS: Gallbladder:  The gallbladder is visualized and no gallstones are
noted.  There is no pain over the gallbladder with compression.

Common bile duct:  The common bile duct is normal measuring 4.9 mm
in diameter.

Liver:  The liver has a normal echogenic pattern.  No ductal
dilatation is seen.

IVC:  Appears normal.

Pancreas:  There is limited visualization of the head and tail of
the pancreas due to overlying bowel gas.  The pancreatic duct is
not dilated.

Spleen:  The spleen is normal measuring 9.1 cm sagittally.

Right Kidney:  No hydronephrosis is seen.  The right kidney
measures 11.1 cm sagittally.

Left Kidney:  No hydronephrosis.  The left kidney measures 12.4 cm.

Abdominal aorta:  The abdominal aorta is normal in caliber.
IMPRESSION: 1.  No gallstones.  No ductal dilatation.
2.  Portions of the pancreas are obscured by bowel gas.

## 2014-05-19 ENCOUNTER — Encounter (HOSPITAL_COMMUNITY): Payer: Self-pay | Admitting: *Deleted

## 2019-08-30 ENCOUNTER — Ambulatory Visit: Payer: BC Managed Care – PPO

## 2019-09-12 ENCOUNTER — Ambulatory Visit: Payer: BC Managed Care – PPO | Attending: Family

## 2019-09-12 DIAGNOSIS — Z23 Encounter for immunization: Secondary | ICD-10-CM | POA: Insufficient documentation

## 2019-09-12 NOTE — Progress Notes (Signed)
   Covid-19 Vaccination Clinic  Name:  Kara Reeves    MRN: [id number] DOB: [birth year]  09/12/2019  [name] was observed post Covid-19 immunization for 15 minutes without incidence. She was provided with Vaccine Information Sheet and instruction to access the V-Safe system.   [name] was instructed to call 911 with any severe reactions post vaccine: Marland Kitchen Difficulty breathing  . Swelling of your face and throat  . A fast heartbeat  . A bad rash all over your body  . Dizziness and weakness    Immunizations Administered    Name Date Dose VIS Date Route   Moderna COVID-19 Vaccine 09/12/2019  2:40 PM 0.5 mL 06/18/2019 Intramuscular   Manufacturer: Moderna   Lot: [id number]   NDC: 87867-672-09

## 2019-10-22 ENCOUNTER — Ambulatory Visit: Payer: BC Managed Care – PPO | Attending: Family

## 2019-10-22 DIAGNOSIS — Z23 Encounter for immunization: Secondary | ICD-10-CM

## 2019-10-22 NOTE — Progress Notes (Signed)
   Covid-19 Vaccination Clinic  Name:  Kara Reeves    MRN: [id number] DOB: Dec 29, 1980  10/22/2019  Kara Reeves was observed post Covid-19 immunization for 15 minutes without incident. She was provided with Vaccine Information Sheet and instruction to access the V-Safe system.   Kara Reeves was instructed to call 911 with any severe reactions post vaccine: Marland Kitchen Difficulty breathing  . Swelling of face and throat  . A fast heartbeat  . A bad rash all over body  . Dizziness and weakness   Immunizations Administered    Name Date Dose VIS Date Route   Moderna COVID-19 Vaccine 10/22/2019  3:45 PM 0.5 mL 06/18/2019 Intramuscular   Manufacturer: Moderna   Lot: [id number]   NDC: 54650-354-65

## 2020-02-26 ENCOUNTER — Other Ambulatory Visit: Payer: Self-pay | Admitting: Obstetrics and Gynecology

## 2020-02-26 DIAGNOSIS — R928 Other abnormal and inconclusive findings on diagnostic imaging of breast: Secondary | ICD-10-CM

## 2020-03-10 ENCOUNTER — Other Ambulatory Visit: Payer: Self-pay

## 2020-03-11 ENCOUNTER — Other Ambulatory Visit: Payer: Self-pay

## 2020-03-11 ENCOUNTER — Ambulatory Visit
Admission: RE | Admit: 2020-03-11 | Discharge: 2020-03-11 | Disposition: A | Discharge status: Home / Self Care | Payer: BC Managed Care – PPO | Source: Ambulatory Visit | Attending: Obstetrics and Gynecology | Admitting: Obstetrics and Gynecology

## 2020-03-11 ENCOUNTER — Other Ambulatory Visit: Payer: Self-pay | Admitting: Obstetrics and Gynecology

## 2020-03-11 DIAGNOSIS — N6489 Other specified disorders of breast: Secondary | ICD-10-CM

## 2020-03-11 DIAGNOSIS — R928 Other abnormal and inconclusive findings on diagnostic imaging of breast: Secondary | ICD-10-CM

## 2020-09-16 ENCOUNTER — Other Ambulatory Visit: Payer: Self-pay

## 2020-09-16 ENCOUNTER — Other Ambulatory Visit: Payer: Self-pay | Admitting: Obstetrics and Gynecology

## 2020-09-16 ENCOUNTER — Ambulatory Visit
Admission: RE | Admit: 2020-09-16 | Discharge: 2020-09-16 | Disposition: A | Discharge status: Home / Self Care | Payer: BC Managed Care – PPO | Source: Ambulatory Visit | Attending: Obstetrics and Gynecology | Admitting: Obstetrics and Gynecology

## 2020-09-16 DIAGNOSIS — N6489 Other specified disorders of breast: Secondary | ICD-10-CM

## 2021-02-17 ENCOUNTER — Other Ambulatory Visit (HOSPITAL_COMMUNITY): Payer: Self-pay | Admitting: Family Medicine

## 2021-02-17 DIAGNOSIS — R1013 Epigastric pain: Secondary | ICD-10-CM

## 2021-03-01 ENCOUNTER — Other Ambulatory Visit (HOSPITAL_COMMUNITY): Payer: BC Managed Care – PPO

## 2021-03-01 ENCOUNTER — Encounter (HOSPITAL_COMMUNITY): Payer: Self-pay

## 2021-08-17 ENCOUNTER — Ambulatory Visit
Admission: RE | Admit: 2021-08-17 | Discharge: 2021-08-17 | Disposition: A | Discharge status: Home / Self Care | Payer: BC Managed Care – PPO | Source: Ambulatory Visit | Attending: Obstetrics and Gynecology | Admitting: Obstetrics and Gynecology

## 2021-08-17 DIAGNOSIS — N6489 Other specified disorders of breast: Secondary | ICD-10-CM

## 2022-06-28 IMAGING — MG DIGITAL DIAGNOSTIC BILAT W/ TOMO W/ CAD
6 of 10 series · 6 of 30 positions shown · non-contrast
Comparison: Previous exam(s).

CLINICAL DATA: 40-year-old female presenting for annual bilateral
mammogram and follow-up of a probably benign right breast asymmetry.

EXAM:
DIGITAL DIAGNOSTIC BILATERAL MAMMOGRAM WITH TOMOSYNTHESIS AND CAD
TECHNIQUE: Bilateral digital diagnostic mammography and breast tomosynthesis
was performed. The images were evaluated with computer-aided
detection.

[L CC synth-2D (1 of 2)]
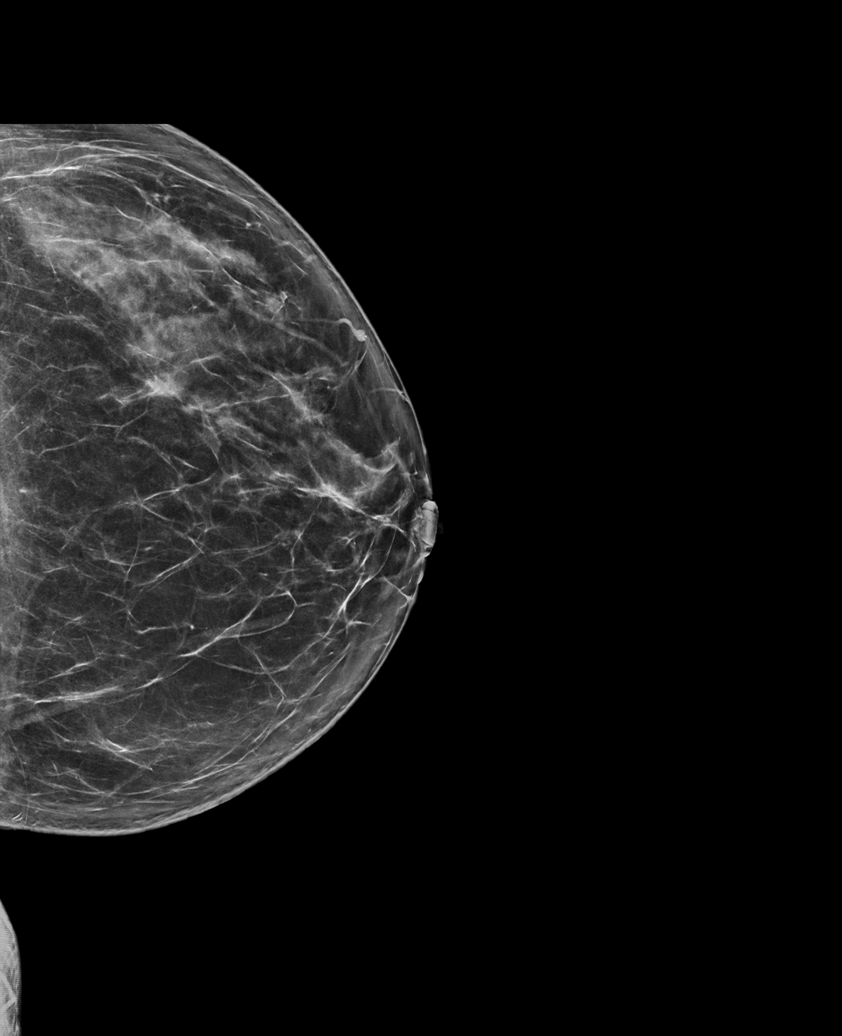

[R CC synth-2D]
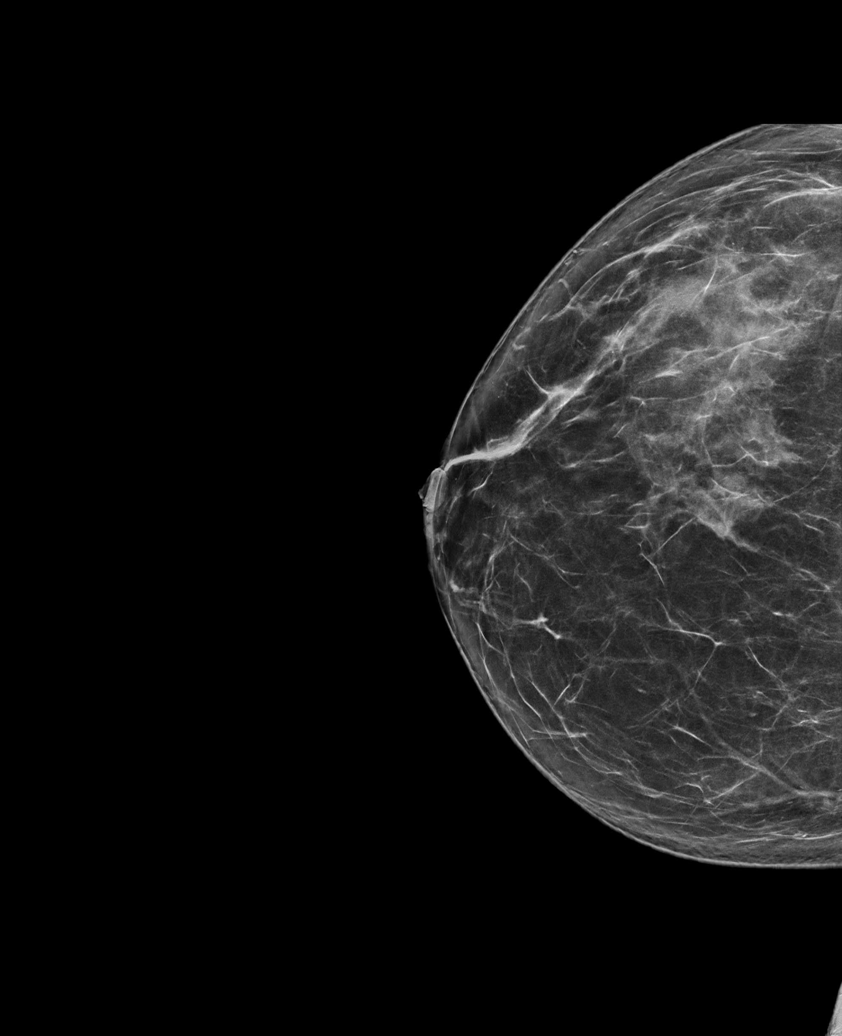

[L MLO synth-2D]
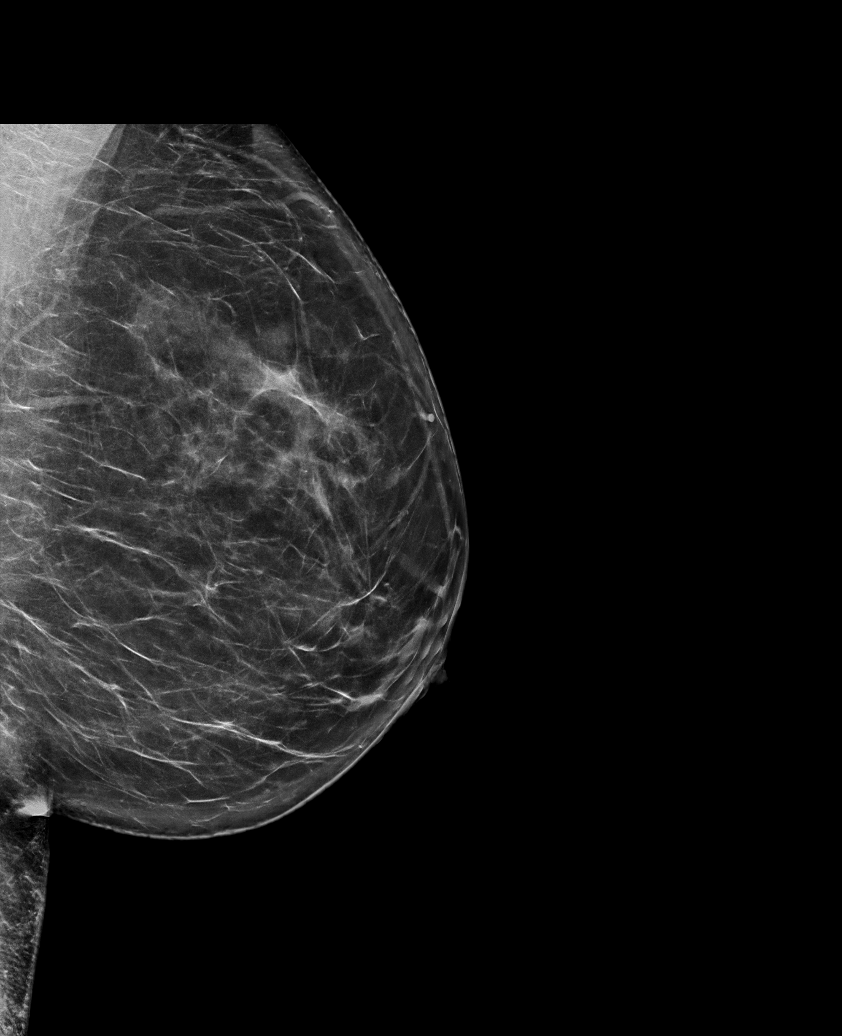

[L CC synth-2D (2 of 2)]
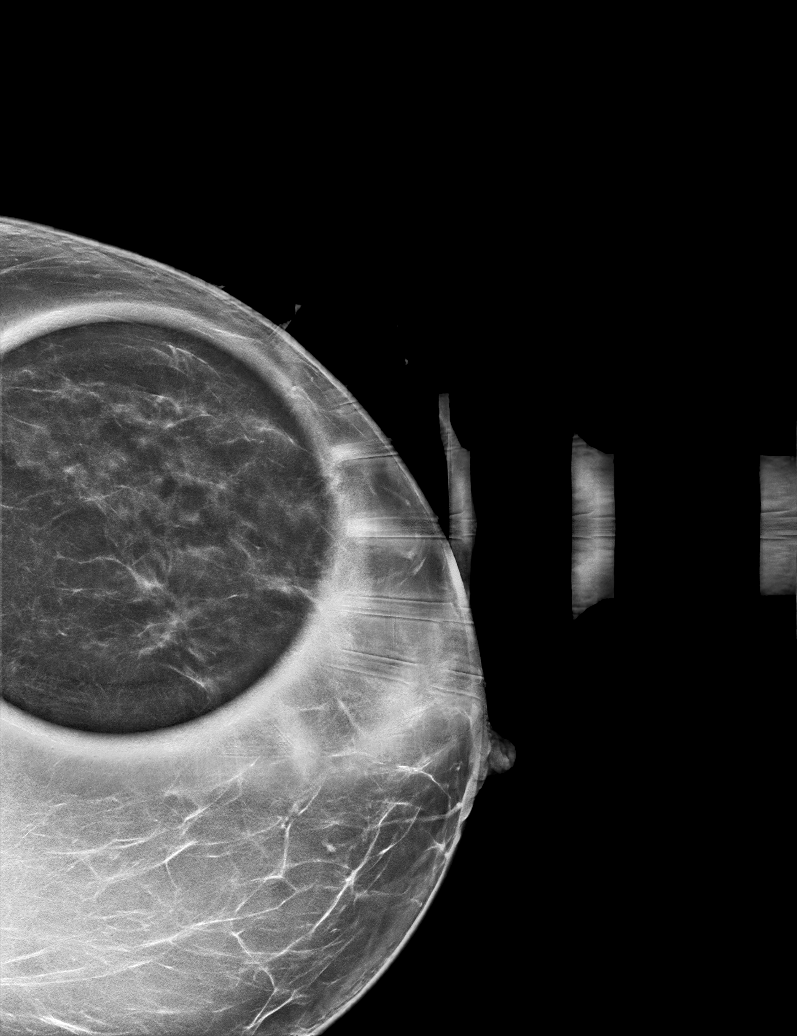

[R MLO synth-2D]
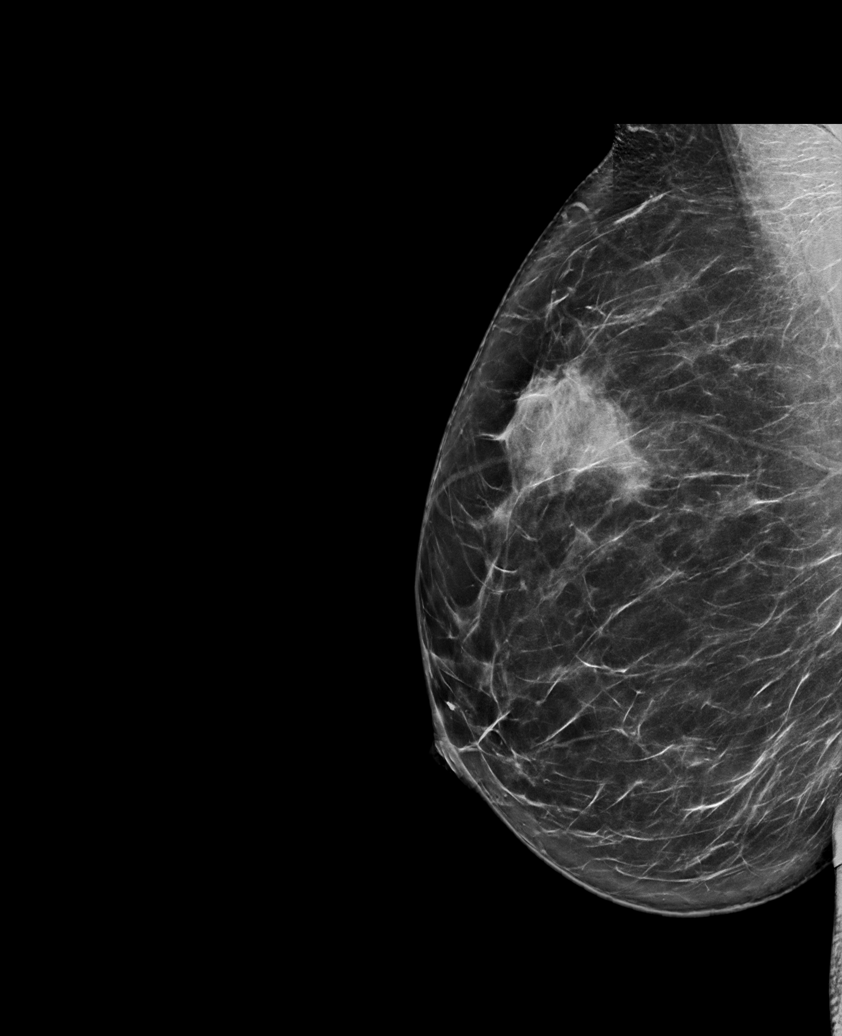

[L MLO tomo · tomo slice 41/81.0]
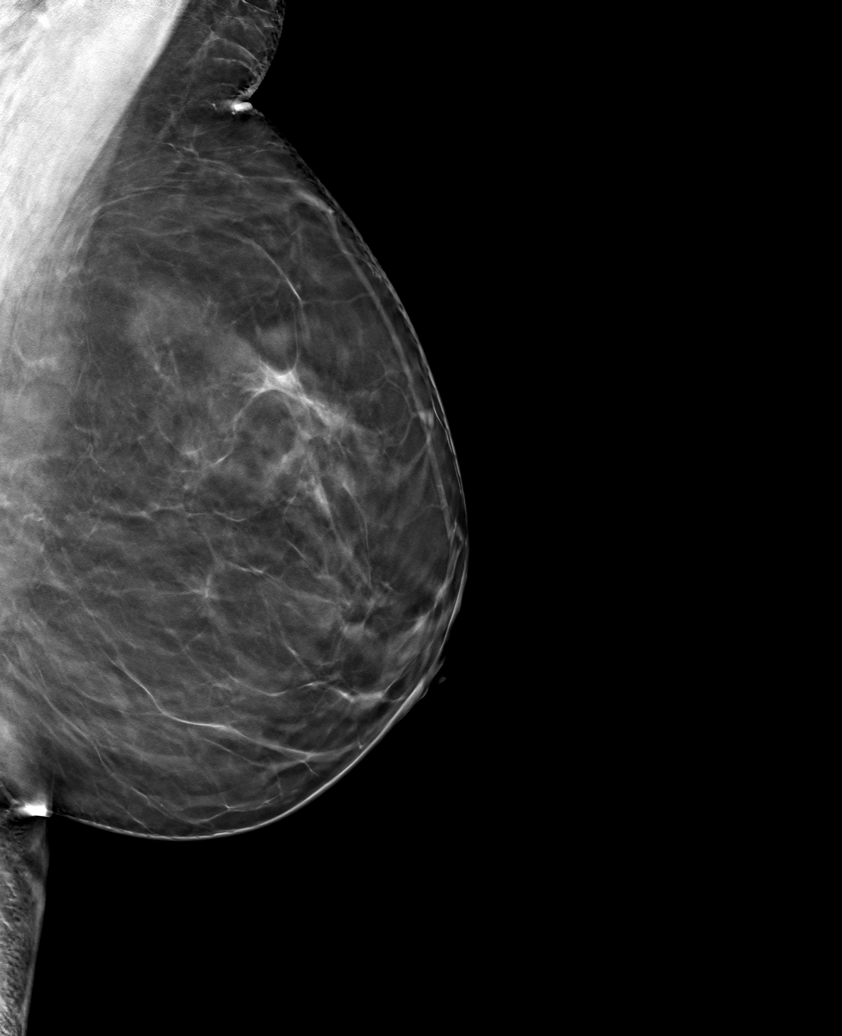

[6 of 30 positions shown; findings below may reference images not displayed]

ACR Breast Density Category c: The breast tissue is heterogeneously
dense, which may obscure small masses.
FINDINGS: Focal asymmetry in the upper outer right breast is mammographically
stable. Otherwise, no new or suspicious findings in either breast.
The parenchymal pattern is unchanged.
IMPRESSION: 1. Stable, probably benign right breast focal asymmetry. Recommend a
final follow-up in 1 year.
2. No mammographic evidence of malignancy on the left.

RECOMMENDATION:
Bilateral diagnostic mammogram in 1 year.

I have discussed the findings and recommendations with the patient.
If applicable, a reminder letter will be sent to the patient
regarding the next appointment.

BI-RADS CATEGORY  3: Probably benign.

## 2022-11-10 ENCOUNTER — Other Ambulatory Visit: Payer: Self-pay | Admitting: Obstetrics and Gynecology

## 2022-11-10 DIAGNOSIS — N6489 Other specified disorders of breast: Secondary | ICD-10-CM

## 2022-11-24 ENCOUNTER — Ambulatory Visit
Admission: RE | Admit: 2022-11-24 | Discharge: 2022-11-24 | Disposition: A | Discharge status: Home / Self Care | Payer: BC Managed Care – PPO | Source: Ambulatory Visit | Attending: Obstetrics and Gynecology | Admitting: Obstetrics and Gynecology

## 2022-11-24 DIAGNOSIS — N6489 Other specified disorders of breast: Secondary | ICD-10-CM
